# Patient Record
Sex: Male | Born: 1980 | Race: Black or African American | Hispanic: No | Marital: Single | Smoking: Never smoker
Health system: Southern US, Community
[De-identification: ages and names within clinical notes are randomized; demographics above are authoritative.]

---

## 2003-10-24 ENCOUNTER — Emergency Department: Payer: Self-pay | Admitting: Emergency Medicine

## 2003-11-04 ENCOUNTER — Emergency Department: Payer: Self-pay | Admitting: General Practice

## 2003-12-10 ENCOUNTER — Emergency Department: Payer: Self-pay | Admitting: Emergency Medicine

## 2008-02-03 ENCOUNTER — Emergency Department: Payer: Self-pay | Admitting: Emergency Medicine

## 2009-09-10 ENCOUNTER — Emergency Department: Payer: Self-pay | Admitting: Emergency Medicine

## 2009-09-29 ENCOUNTER — Emergency Department: Payer: Self-pay | Admitting: Emergency Medicine

## 2015-05-24 ENCOUNTER — Emergency Department: Payer: Self-pay

## 2015-05-24 ENCOUNTER — Emergency Department
Admission: EM | Admit: 2015-05-24 | Discharge: 2015-05-24 | Disposition: A | Discharge status: Home / Self Care | Payer: Self-pay | Attending: Emergency Medicine | Admitting: Emergency Medicine

## 2015-05-24 ENCOUNTER — Encounter: Payer: Self-pay | Admitting: *Deleted

## 2015-05-24 DIAGNOSIS — Y92410 Unspecified street and highway as the place of occurrence of the external cause: Secondary | ICD-10-CM | POA: Insufficient documentation

## 2015-05-24 DIAGNOSIS — S42021A Displaced fracture of shaft of right clavicle, initial encounter for closed fracture: Secondary | ICD-10-CM | POA: Insufficient documentation

## 2015-05-24 DIAGNOSIS — Z880 Allergy status to penicillin: Secondary | ICD-10-CM | POA: Insufficient documentation

## 2015-05-24 DIAGNOSIS — G930 Cerebral cysts: Secondary | ICD-10-CM | POA: Insufficient documentation

## 2015-05-24 DIAGNOSIS — Y939 Activity, unspecified: Secondary | ICD-10-CM | POA: Insufficient documentation

## 2015-05-24 DIAGNOSIS — Y999 Unspecified external cause status: Secondary | ICD-10-CM | POA: Insufficient documentation

## 2015-05-24 DIAGNOSIS — Q046 Congenital cerebral cysts: Secondary | ICD-10-CM

## 2015-05-24 DIAGNOSIS — S42001A Fracture of unspecified part of right clavicle, initial encounter for closed fracture: Secondary | ICD-10-CM

## 2015-05-24 MED ORDER — OXYCODONE-ACETAMINOPHEN 5-325 MG PO TABS
1.0000 | ORAL_TABLET | Freq: Once | ORAL | Status: AC
  Administered 2015-05-24: 1 via ORAL
  Filled 2015-05-24: qty 1

## 2015-05-24 MED ORDER — OXYCODONE-ACETAMINOPHEN 5-325 MG PO TABS: 1.0000 | ORAL_TABLET | Freq: Four times a day (QID) | ORAL | Status: DC | PRN

## 2015-05-24 NOTE — ED Notes (Signed)
After results come back from testing, provider determined level of acuity needed to be increased to a level 3

## 2015-05-24 NOTE — Discharge Instructions (Signed)
Clavicle Fracture The clavicle, also called the collarbone, is the long bone that connects your shoulder to your rib cage. You can feel your collarbone at the top of your shoulders and rib cage. A clavicle fracture is a broken clavicle. It is a common injury that can happen at any age.  CAUSES Common causes of a clavicle fracture include:  A direct blow to your shoulder.  A car accident.  A fall, especially if you try to break your fall with an outstretched arm. RISK FACTORS You may be at increased risk if:  You are younger than 25 years or older than 53 years. Most clavicle fractures happen to people who are younger than 25 years.  You are a male.  You play contact sports. SIGNS AND SYMPTOMS A fractured clavicle is painful. It also makes it hard to move your arm. Other signs and symptoms may include:  A shoulder that drops downward and forward.  Pain when trying to lift your shoulder.  Bruising, swelling, and tenderness over your clavicle.  A grinding noise when you try to move your shoulder.  A bump over your clavicle. DIAGNOSIS Your health care provider can usually diagnose a clavicle fracture by asking about your injury and examining your shoulder and clavicle. He or she may take an X-ray to determine the position of your clavicle. TREATMENT Treatment depends on the position of your clavicle after the fracture:  If the broken ends of the bone are not out of place, your health care provider may put your arm in a sling or wrap a support bandage around your chest (figure-of-eight wrap).  If the broken ends of the bone are out of place, you may need surgery. Surgery may involve placing screws, pins, or plates to keep your clavicle stable while it heals. Healing may take about 3 months. When your health care provider thinks your fracture has healed enough, you may have to do physical therapy to regain normal movement and build up your arm strength. HOME CARE INSTRUCTIONS    Apply ice to the injured area:  Put ice in a plastic bag.  Place a towel between your skin and the bag.  Leave the ice on for 20 minutes, 2-3 times a day.  If you have a wrap or splint:  Wear it all the time, and remove it only to take a bath or shower.  When you bathe or shower, keep your shoulder in the same position as when the sling or wrap is on.  Do not lift your arm.  If you have a figure-of-eight wrap:  Another person must tighten it every day.  It should be tight enough to hold your shoulders back.  Allow enough room to place your index finger between your body and the strap.  Loosen the wrap immediately if you feel numbness or tingling in your hands.  Only take medicines as directed by your health care provider.  Avoid activities that make the injury or pain worse for 4-6 weeks after surgery.  Keep all follow-up appointments. SEEK MEDICAL CARE IF:  Your medicine is not helping to relieve pain and swelling. SEEK IMMEDIATE MEDICAL CARE IF:  Your arm is numb, cold, or pale, even when the splint is loose. MAKE SURE YOU:   Understand these instructions.  Will watch your condition.  Will get help right away if you are not doing well or get worse.   This information is not intended to replace advice given to you by your health care provider.  Make sure you discuss any questions you have with your health care provider.   Document Released: 10/10/2004 Document Revised: 01/05/2013 Document Reviewed: 11/23/2012 Elsevier Interactive Patient Education 2016 Elsevier Inc.  Arachnoid Cysts An arachnoid cyst is a fluid-filled sac in the space between your brain or spinal cord and one of the membranes that covers the brain or spinal cord (arachnoid membrane). The cyst is filled with clear fluid (cerebrospinal fluid) that protects and nourishes your brain and spinal cord. Arachnoid cysts are not cancerous. Arachnoid cysts usually develop in the middle part of your brain.  They can also develop near your spinal cord. If a cyst is small and not causing symptoms, you may not need treatment. Your health care provider may check the cyst regularly to see if it grows over time. If your cyst is large enough to cause symptoms, you may need a surgical procedure to drain or remove it. CAUSES  The most common cause of an arachnoid cyst is a defect that develops before birth. This is called a primary arachnoid cyst. A secondary arachnoid cyst is less common and may be caused by:  Head injury.  Brain infection.  A tumor that develops after brain surgery. RISK FACTORS  You may be at risk for a primary arachnoid cyst if you:   Have a family history of brain cysts.  Have inherited genes that increase your risk.  Are male. You may be at risk for a secondary arachnoid cyst if you have had:  A brain injury.  A brain infection.  A brain tumor.  Brain surgery. SIGNS AND SYMPTOMS  It is possible to have an arachnoid cyst without any symptoms. If you do have symptoms, the type of symptoms will depend on the size and location of your cyst. Signs and symptoms of an arachnoid cyst near your brain may include:  Nausea and vomiting.  Headache.  Seizures.  Dizziness and loss of balance.  Problems with hearing or vision.  Clumsiness.  Weakness. Signs and symptoms of an arachnoid cyst near your spinal cord may include:  Leg weakness and tingling.  Arm weakness and tingling.  Curved spine.  Back pain.  Arm or leg pain.  Jerky movements of the arms or legs (spasticity). DIAGNOSIS  Your health care provider may suspect an arachnoid cyst from your signs and symptoms. You will need to have tests done to confirm the diagnosis. These may include:  CT scan.  MRI. TREATMENT  Surgery is the only treatment for an arachnoid cyst that causes symptoms. This may involve brain or spinal cord surgery to do one of the following:  Open and drain the cyst.  Drain the  cyst by placing a tube (shunt) into the cyst.  Remove the cyst completely.   This information is not intended to replace advice given to you by your health care provider. Make sure you discuss any questions you have with your health care provider.   Document Released: 12/21/2001 Document Revised: 01/21/2014 Document Reviewed: 02/18/2013 Elsevier Interactive Patient Education [employer] Elsevier Inc.
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
## 2015-08-04 ENCOUNTER — Other Ambulatory Visit: Payer: Self-pay | Admitting: Neurosurgery

## 2015-08-04 DIAGNOSIS — Q046 Congenital cerebral cysts: Secondary | ICD-10-CM

## 2015-09-11 ENCOUNTER — Ambulatory Visit: Payer: Self-pay

## 2016-11-14 LAB — HM HIV SCREENING LAB: HM HIV Screening: NEGATIVE
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
## 2019-03-16 ENCOUNTER — Other Ambulatory Visit: Payer: Self-pay

## 2019-03-16 ENCOUNTER — Encounter: Payer: Self-pay | Admitting: Physician Assistant

## 2019-03-16 ENCOUNTER — Ambulatory Visit: Payer: Self-pay | Admitting: Physician Assistant

## 2019-03-16 DIAGNOSIS — Z113 Encounter for screening for infections with a predominantly sexual mode of transmission: Secondary | ICD-10-CM

## 2019-03-16 LAB — GRAM STAIN

## 2019-03-16 NOTE — Progress Notes (Signed)
   [facility] Department STI clinic/screening visit  Subjective:  Dalton Herring. is a 39 y.o. male being seen today for an STI screening visit. The patient reports they do not have symptoms.    Patient has the following medical conditions:  There are no problems to display for this patient.    Chief Complaint  Patient presents with  . SEXUALLY TRANSMITTED DISEASE    HPI  Patient reports that he is not having any symptoms but would like a screening today.  Denies chronic conditions, medications and history of surgery.   See flowsheet for further details and programmatic requirements.    The following portions of the patient's history were reviewed and updated as appropriate: allergies, current medications, past medical history, past social history, past surgical history and problem list.  Objective:  There were no vitals filed for this visit.  Physical Exam Constitutional:      General: He is not in acute distress.    Appearance: Normal appearance. He is normal weight.  HENT:     Head: Normocephalic and atraumatic.     Comments: No nits, lice or hair loss. No cervical, supraclavicular or axillary adenopathy.    Mouth/Throat:     Mouth: Mucous membranes are moist.     Pharynx: Oropharynx is clear. No oropharyngeal exudate or posterior oropharyngeal erythema.  Eyes:     Conjunctiva/sclera: Conjunctivae normal.  Pulmonary:     Effort: Pulmonary effort is normal.  Abdominal:     Palpations: Abdomen is soft. There is no mass.     Tenderness: There is no abdominal tenderness. There is no guarding or rebound.  Genitourinary:    Penis: Normal.      Testes: Normal.     Comments: Pubic area without nits, lice, edema, erythema, lesions or inguinal adenopathy. Penis circumcised, without rash, lesion or discharge at meatus.  Musculoskeletal:     Cervical back: Neck supple. No tenderness.  Skin:    General: Skin is warm and dry.     Findings: No bruising,  erythema, lesion or rash.  Neurological:     Mental Status: He is alert and oriented to person, place, and time.  Psychiatric:        Mood and Affect: Mood normal.        Behavior: Behavior normal.        Thought Content: Thought content normal.        Judgment: Judgment normal.       Assessment and Plan:  Catalino Plascencia. is a 39 y.o. male presenting to the [facility] Department for STI screening  1. Screening for STD (sexually transmitted disease) Patient into clinic without symptoms.  Rec condoms with all sex. Await test results.  Counseled that RN will call if needs to RTC for treatment once results are back. - Gram stain - Gonococcus culture - HIV/HCV [place] [facility] - Syphilis Serology, [facility] - Gonococcus culture     No follow-ups on file.  No future appointments.  Matt Holmes, PA

## 2019-03-21 LAB — GONOCOCCUS CULTURE

## 2019-03-22 LAB — HM HIV SCREENING LAB: HM HIV Screening: NEGATIVE

## 2019-03-22 LAB — HM HEPATITIS C SCREENING LAB: HM Hepatitis Screen: NEGATIVE

## 2020-02-07 ENCOUNTER — Ambulatory Visit
Admission: RE | Admit: 2020-02-07 | Discharge: 2020-02-07 | Disposition: A | Discharge status: Home / Self Care | Payer: BLUE CROSS/BLUE SHIELD | Source: Ambulatory Visit | Attending: Family Medicine | Admitting: Family Medicine

## 2020-02-07 ENCOUNTER — Other Ambulatory Visit: Payer: Self-pay

## 2020-02-07 VITALS — BP 128/82 | HR 73 | Temp 98.4°F | Resp 18 | Ht 73.0 in | Wt 168.0 lb

## 2020-02-07 DIAGNOSIS — Z91040 Latex allergy status: Secondary | ICD-10-CM | POA: Diagnosis not present

## 2020-02-07 DIAGNOSIS — Z880 Allergy status to penicillin: Secondary | ICD-10-CM | POA: Diagnosis not present

## 2020-02-07 DIAGNOSIS — R0981 Nasal congestion: Secondary | ICD-10-CM | POA: Diagnosis present

## 2020-02-07 DIAGNOSIS — U071 COVID-19: Secondary | ICD-10-CM | POA: Insufficient documentation

## 2020-02-07 DIAGNOSIS — J069 Acute upper respiratory infection, unspecified: Secondary | ICD-10-CM

## 2020-02-07 LAB — RAPID INFLUENZA A&B ANTIGENS
Influenza A (ARMC): NEGATIVE
Influenza B (ARMC): NEGATIVE

## 2020-02-07 NOTE — ED Provider Notes (Signed)
[facility]    CSN: [id number] Arrival date & time: 02/07/20  1646      History   Chief Complaint No chief complaint on file.   HPI Adolf Ormiston. is a 40 y.o. male.   HPI   40 year old male here for evaluation of body aches, nasal congestion, fever, and chills that started yesterday.  Patient reports his T-max was one 1.5.  He has had associated symptoms of runny nose and nasal congestion.  Patient denies shortness of breath, wheezing, cough, sore throat, or GI complaints.  Patient has not been vaccinated against Covid or flu but is unaware of any Covid exposure.  History reviewed. No pertinent past medical history.  There are no problems to display for this patient.   History reviewed. No pertinent surgical history.     Home Medications    Prior to Admission medications   Not on File    Family History Family History  Problem Relation Age of Onset  . Cancer - Lung Mother   . Liver disease Father     Social History Social History   Tobacco Use  . Smoking status: Never Smoker  . Smokeless tobacco: Never Used  Vaping Use  . Vaping Use: Never used  Substance Use Topics  . Alcohol use: Yes    Comment: 3- 4 times a week  . Drug use: Yes    Types: Marijuana     Allergies   Latex and Penicillins   Review of Systems Review of Systems  Constitutional: Positive for fever. Negative for activity change and appetite change.  HENT: Positive for congestion and rhinorrhea. Negative for ear pain and sore throat.   Respiratory: Negative for cough, shortness of breath and wheezing.   Gastrointestinal: Negative for diarrhea, nausea and vomiting.  Musculoskeletal: Positive for arthralgias and myalgias.  Skin: Negative for rash.  Neurological: Positive for headaches.  Hematological: Negative.   Psychiatric/Behavioral: Negative.      Physical Exam Triage Vital Signs ED Triage Vitals  Enc Vitals Group     BP 02/07/20 1714 128/82     Pulse  Rate 02/07/20 1714 73     Resp 02/07/20 1714 18     Temp 02/07/20 1714 98.4 F (36.9 C)     Temp Source 02/07/20 1714 Oral     SpO2 02/07/20 1714 99 %     Weight 02/07/20 1711 168 lb (76.2 kg)     Height 02/07/20 1711 6\' 1"  (1.854 m)     Head Circumference --      Peak Flow --      Pain Score 02/07/20 1711 0     Pain Loc --      Pain Edu? --      Excl. in GC? --    No data found.  Updated Vital Signs BP 128/82   Pulse 73   Temp 98.4 F (36.9 C) (Oral)   Resp 18   Ht 6\' 1"  (1.854 m)   Wt 168 lb (76.2 kg)   SpO2 99%   BMI 22.16 kg/m   Visual Acuity Right Eye Distance:   Left Eye Distance:   Bilateral Distance:    Right Eye Near:   Left Eye Near:    Bilateral Near:     Physical Exam Vitals and nursing note reviewed.  Constitutional:      General: He is not in acute distress.    Appearance: Normal appearance. He is normal weight. He is not toxic-appearing.  HENT:  Head: Normocephalic and atraumatic.     Right Ear: Tympanic membrane, ear canal and external ear normal.     Left Ear: Tympanic membrane, ear canal and external ear normal.     Nose: Congestion and rhinorrhea present.     Comments: Nasal mucosa is erythematous and edematous with scant clear nasal discharge.    Mouth/Throat:     Mouth: Mucous membranes are moist.     Pharynx: Oropharynx is clear. No oropharyngeal exudate or posterior oropharyngeal erythema.  Cardiovascular:     Rate and Rhythm: Normal rate and regular rhythm.     Pulses: Normal pulses.     Heart sounds: Normal heart sounds. No murmur heard. No gallop.   Pulmonary:     Effort: Pulmonary effort is normal.     Breath sounds: Normal breath sounds. No wheezing, rhonchi or rales.  Musculoskeletal:     Cervical back: Normal range of motion and neck supple.  Lymphadenopathy:     Cervical: No cervical adenopathy.  Skin:    General: Skin is warm and dry.     Capillary Refill: Capillary refill takes less than 2 seconds.     Findings: No  erythema.  Neurological:     General: No focal deficit present.     Mental Status: He is alert and oriented to person, place, and time.  Psychiatric:        Mood and Affect: Mood normal.        Behavior: Behavior normal.        Thought Content: Thought content normal.        Judgment: Judgment normal.      UC Treatments / Results  Labs (all labs ordered are listed, but only abnormal results are displayed) Labs Reviewed  SARS CORONAVIRUS 2 (TAT 6-24 HRS)  RAPID INFLUENZA A&B ANTIGENS    EKG   Radiology No results found.  Procedures Procedures (including critical care time)  Medications Ordered in UC Medications - No data to display  Initial Impression / Assessment and Plan / UC Course  I have reviewed the triage vital signs and the nursing notes.  Pertinent labs & imaging results that were available during my care of the patient were reviewed by me and considered in my medical decision making (see chart for details).   Patient presents for evaluation of Covid-like symptoms that started yesterday.  Patient's upper respiratory exam reveals erythematous and edematous nasal mucosa.  Oropharyngeal exam is negative for erythema, exudate, or postnasal drip.  No cervical lymphadenopathy appreciated on exam.  Lungs are clear to auscultation all fields.  Will swab patient for Covid and discharge him home to isolate.  Advised supportive care.   Final Clinical Impressions(s) / UC Diagnoses   Final diagnoses:  Acute upper respiratory infection, unspecified     Discharge Instructions     Isolate at home until the results of your Covid test are back.  If you test positive then you will have to quarantine for 5 days from when your symptoms started.  After the 5 days you can break quarantine if your symptoms have improved and you have not had a fever for 24 hours without taking Tylenol or ibuprofen.  Use over-the-counter Tylenol and ibuprofen as needed for pain and fever.  If you  develop shortness of breath-especially at rest, you were unable to speak in full sentences, or as a late sign your lips start turning blue in the go the ER for evaluation.    ED Prescriptions  None     PDMP not reviewed this encounter.   Becky Augusta, NP 02/07/20 [phone]

## 2020-02-07 NOTE — ED Triage Notes (Signed)
Pt presents with c/o general body aches, nasal congestion fever (101.5) and chills since yesterday. Pt denies n/v/d. Pt did take an at-home COVID test and it was negative. Pt has taken Tylenol with some improvement. Pt denies any sick contacts.

## 2020-02-07 NOTE — Discharge Instructions (Addendum)
Isolate at home until the results of your Covid test are back.  If you test positive then you will have to quarantine for 5 days from when your symptoms started.  After the 5 days you can break quarantine if your symptoms have improved and you have not had a fever for 24 hours without taking Tylenol or ibuprofen.  Use over-the-counter Tylenol and ibuprofen as needed for pain and fever.  If you develop shortness of breath-especially at rest, you were unable to speak in full sentences, or as a late sign your lips start turning blue in the go the ER for evaluation.

## 2020-02-08 LAB — SARS CORONAVIRUS 2 (TAT 6-24 HRS): SARS Coronavirus 2: POSITIVE — AB

## 2021-02-21 ENCOUNTER — Other Ambulatory Visit: Payer: Self-pay

## 2021-02-21 ENCOUNTER — Encounter: Payer: Self-pay | Admitting: Family Medicine

## 2021-02-21 ENCOUNTER — Ambulatory Visit: Payer: Self-pay | Admitting: Family Medicine

## 2021-02-21 DIAGNOSIS — Z113 Encounter for screening for infections with a predominantly sexual mode of transmission: Secondary | ICD-10-CM

## 2021-02-21 LAB — GRAM STAIN

## 2021-02-21 NOTE — Progress Notes (Signed)
Pt here for STD screening.  Gram stain results reviewed, no treatment required.  Pt declined condoms. Marelin Tat M Denina Rieger, RN  

## 2021-02-21 NOTE — Progress Notes (Signed)
[facility] Department STI clinic/screening visit  Subjective:  Dalton Rueb. is a 41 y.o. male being seen today for an STI screening visit. The patient reports they do not have symptoms.    Patient has the following medical conditions:  There are no problems to display for this patient.    Chief Complaint  Patient presents with   SEXUALLY TRANSMITTED DISEASE    screening    HPI  Patient reports he is here for STD screen.  Denies symptoms.  Does the patient or their partner desires a pregnancy in the next year? No  Screening for MPX risk: Does the patient have an unexplained rash? No Is the patient MSM? No Does the patient endorse multiple sex partners or anonymous sex partners? No Did the patient have close or sexual contact with a person diagnosed with MPX? No Has the patient traveled outside the [place] where MPX is endemic? No Is there a high clinical suspicion for MPX-- evidenced by one of the following No  -Unlikely to be chickenpox  -Lymphadenopathy  -Rash that present in same phase of evolution on any given body part   See flowsheet for further details and programmatic requirements.    The following portions of the patient's history were reviewed and updated as appropriate: allergies, current medications, past medical history, past social history, past surgical history and problem list.  Objective:  There were no vitals filed for this visit.  Physical Exam Constitutional:      Appearance: Normal appearance.  HENT:     Head: Normocephalic and atraumatic.     Comments: No nits or hair loss    Mouth/Throat:     Mouth: Mucous membranes are moist.     Pharynx: Oropharynx is clear. No oropharyngeal exudate or posterior oropharyngeal erythema.  Pulmonary:     Effort: Pulmonary effort is normal.  Abdominal:     General: Abdomen is flat.     Palpations: Abdomen is soft. There is no hepatomegaly or mass.     Tenderness: There is no abdominal tenderness.   Genitourinary:    Pubic Area: No rash or pubic lice.      Penis: Normal.      Testes: Normal.     Epididymis:     Right: Normal.     Left: Normal.     Rectum: Normal.  Lymphadenopathy:     Head:     Right side of head: No preauricular or posterior auricular adenopathy.     Left side of head: No preauricular or posterior auricular adenopathy.     Cervical: No cervical adenopathy.     Upper Body:     Right upper body: No supraclavicular or axillary adenopathy.     Left upper body: No supraclavicular or axillary adenopathy.     Lower Body: No right inguinal adenopathy. No left inguinal adenopathy.  Skin:    General: Skin is warm and dry.     Findings: No rash.  Neurological:     Mental Status: He is alert and oriented to person, place, and time.      Assessment and Plan:  Dalton Herring. is a 41 y.o. male presenting to the [facility] Department for STI screening  1. Screening examination for venereal disease  - Gram stain - Gonococcus culture - Gonococcus culture  Co. To Use condoms for STD prevention  Return if symptoms worsen or fail to improve.  No future appointments.  Larene Pickett, FNP

## 2021-02-25 LAB — GONOCOCCUS CULTURE

## 2021-03-28 ENCOUNTER — Ambulatory Visit: Payer: Self-pay

## 2021-03-28 ENCOUNTER — Ambulatory Visit: Payer: Self-pay | Admitting: Nurse Practitioner

## 2021-03-28 ENCOUNTER — Other Ambulatory Visit: Payer: Self-pay

## 2021-03-28 DIAGNOSIS — Z113 Encounter for screening for infections with a predominantly sexual mode of transmission: Secondary | ICD-10-CM

## 2021-03-28 LAB — GRAM STAIN

## 2021-03-28 LAB — HM HEPATITIS C SCREENING LAB: HM Hepatitis Screen: NEGATIVE

## 2021-03-28 LAB — HM HIV SCREENING LAB: HM HIV Screening: NEGATIVE

## 2021-03-28 LAB — HEPATITIS B SURFACE ANTIGEN: Hepatitis B Surface Ag: NONREACTIVE

## 2021-03-28 NOTE — Progress Notes (Signed)
Pt here for STD screening.  Gram stain results reviewed, no treatment required per SO.  Pt declined condoms.  Windle Guard, RN ? ?

## 2021-03-28 NOTE — Progress Notes (Signed)
[facility] ?STI clinic/screening visit ? ?Subjective:  ?Dalton Herring. is a 41 y.o. male being seen today for an STI screening visit. The patient reports they do have symptoms.   ? ?Patient has the following medical conditions:  There are no problems to display for this patient. ? ? ? ?Chief Complaint  ?Patient presents with  ? SEXUALLY TRANSMITTED DISEASE  ?  Screening  ? ? ?HPI ? ?Patient reports to clinic today for STD screening.  Patient reports discharge that began today. Patient states discharge is clear and reports no contacts with similar symptoms.   ? ?Does the patient or their partner desires a pregnancy in the next year? No ? ?Screening for MPX risk: ?Does the patient have an unexplained rash? No ?Is the patient MSM? No ?Does the patient endorse multiple sex partners or anonymous sex partners? No ?Did the patient have close or sexual contact with a person diagnosed with MPX? No ?Has the patient traveled outside the [place] where MPX is endemic? No ?Is there a high clinical suspicion for MPX-- evidenced by one of the following No ? -Unlikely to be chickenpox ? -Lymphadenopathy ? -Rash that present in same phase of evolution on any given body part ? ? ?See flowsheet for further details and programmatic requirements.  ? ? ?The following portions of the patient's history were reviewed and updated as appropriate: allergies, current medications, past medical history, past social history, past surgical history and problem list. ? ?Objective:  ?There were no vitals filed for this visit. ? ?Physical Exam ?Constitutional:   ?   Appearance: Normal appearance. He is well-developed.  ?HENT:  ?   Head: Normocephalic.  ?   Right Ear: External ear normal.  ?   Left Ear: External ear normal.  ?   Nose: Nose normal.  ?   Mouth/Throat:  ?   Mouth: Mucous membranes are moist.  ?   Comments: Poor dentition  ?Pulmonary:  ?   Effort: Pulmonary effort is normal.  ?Abdominal:  ?   General: Abdomen is flat.  ?    Palpations: Abdomen is soft.  ?Genitourinary: ?   Penis: Circumcised.   ?   Comments: Pubic area without nits, lice, hair loss, edema, erythema, lesions and inguinal adenopathy. ?Penis without rash, lesions and discharge at meatus. ?Testicles descended bilaterally,nt, no masses or edema.  ?Musculoskeletal:  ?   Cervical back: Full passive range of motion without pain, normal range of motion and neck supple.  ?Lymphadenopathy:  ?   Cervical: Cervical adenopathy present.  ?   Left cervical: Deep cervical adenopathy present.  ?   Lower Body: Left inguinal adenopathy present.  ?Skin: ?   General: Skin is warm and dry.  ?Neurological:  ?   Mental Status: He is alert and oriented to person, place, and time.  ?Psychiatric:     ?   Attention and Perception: Attention normal.     ?   Mood and Affect: Mood normal.     ?   Speech: Speech normal.     ?   Behavior: Behavior is cooperative.  ? ? ? ? ?Assessment and Plan:  ?[name] is a 41 y.o. male presenting to the [facility] for STI screening ? ?1. Screening examination for venereal disease ?-41 year old male in clinic today for STD screening.  ?-No clear discharge noted on exam.  Will await test results. ?-Patient does have STI symptoms ?Patient accepted all screenings including  oral GC and bloodwork for HIV/RPR, HBV/HCV.  ?Patient meets criteria for HepB screening? Yes. Ordered? Yes ?Patient meets criteria for HepC screening? Yes. Ordered? Yes ?Recommended condom use with all sex ?Discussed importance of condom use for STI prevent ? ?Treat gram stain per standing order ? ?Discussed time line for State Lab results and that patient will be called with positive results and encouraged patient to call if he had not heard in 2 weeks ?Recommended returning for continued or worsening symptoms.   ? ?- HIV/HCV [facility] ?- Syphilis Serology,  Lab ?- HBV Antigen/Antibody [facility] ?- Gonococcus culture ?- Gonococcus culture ?- Gram  stain ? ? ? ? ?Return if symptoms worsen or fail to improve. ? ?No future appointments. ? ?Dalton Cromer, FNP ? ?

## 2021-04-02 LAB — GONOCOCCUS CULTURE

## 2021-05-28 DIAGNOSIS — R251 Tremor, unspecified: Secondary | ICD-10-CM | POA: Insufficient documentation

## 2022-02-26 ENCOUNTER — Ambulatory Visit: Payer: Self-pay | Admitting: Family Medicine

## 2022-02-26 ENCOUNTER — Encounter: Payer: Self-pay | Admitting: Family Medicine

## 2022-02-26 DIAGNOSIS — Z113 Encounter for screening for infections with a predominantly sexual mode of transmission: Secondary | ICD-10-CM

## 2022-02-26 LAB — HM HIV SCREENING LAB: HM HIV Screening: NEGATIVE

## 2022-02-26 LAB — HM HEPATITIS C SCREENING LAB: HM Hepatitis Screen: NEGATIVE

## 2022-02-26 NOTE — Progress Notes (Signed)
[facility] Department STI clinic/screening visit  Subjective:  Jaiveer Trabert Junior Graessle is a 42 y.o. male being seen today for an STI screening visit. The patient reports they do not have symptoms.    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 No  Screening for MPX risk: Does the patient have an unexplained rash? No Is the patient MSM? No Does the patient endorse multiple sex partners or anonymous sex partners? No Did the patient have close or sexual contact with a person diagnosed with MPX? No Has the patient traveled outside the [place] where MPX is endemic? No Is there a high clinical suspicion for MPX-- evidenced by one of the following No  -Unlikely to be chickenpox  -Lymphadenopathy  -Rash that present in same phase of evolution on any given body part   See flowsheet for further details and programmatic requirements.   Immunization History  Administered Date(s) Administered   Tdap 10/09/2007, 05/28/2021     The following portions of the patient's history were reviewed and updated as appropriate: allergies, current medications, past medical history, past social history, past surgical history and problem list.  Objective:  There were no vitals filed for this visit.  Physical Exam Vitals and nursing note reviewed.  Constitutional:      Appearance: Normal appearance.  HENT:     Head: Normocephalic and atraumatic.     Mouth/Throat:     Mouth: Mucous membranes are  moist.     Pharynx: No oropharyngeal exudate or posterior oropharyngeal erythema.  Eyes:     General:        Right eye: No discharge.        Left eye: No discharge.     Conjunctiva/sclera:     Right eye: Right conjunctiva is not injected. No exudate.    Left eye: Left conjunctiva is not injected. No exudate. Pulmonary:     Effort: Pulmonary effort is normal.  Abdominal:     General: Abdomen is flat.     Palpations: Abdomen is soft. There is no hepatomegaly or mass.     Tenderness: There is no abdominal tenderness. There is no rebound.  Genitourinary:    Comments: Declined genital exam- asymptomatic Lymphadenopathy:     Cervical: No cervical adenopathy.     Upper Body:     Right upper body: No supraclavicular or axillary adenopathy.     Left upper body: No supraclavicular or axillary adenopathy.  Skin:    General: Skin is warm and dry.  Neurological:     Mental Status: He is alert and oriented to person, place, and time.    Assessment and Plan:  Prudencio Burly Junior Fluckiger is a 42 y.o. male presenting to the [facility] for STI screening  1. Screening for venereal disease  - Chlamydia/GC NAA, Confirmation - HIV [facility] - Syphilis Serology, [facility] - HIV/HCV [facility] [facility] - HBV Antigen/Antibody [facility]  Lab - Gonococcus culture   Patient does not have STI symptoms Patient accepted all screenings including   Patient meets criteria for HepB screening? Yes. Ordered? yes Patient meets criteria for HepC screening? Yes. Ordered? yes Recommended condom use with all sex Discussed importance of condom use for STI prevent  Treat positive test results per standing order. Discussed time line for State Lab results and that patient will be called with positive results and encouraged patient to call if he had not heard in 2 weeks Recommended repeat testing in 3 months with positive results. Recommended returning for continued or worsening symptoms.    Return if symptoms worsen or fail to improve.  Total time spent 20 minutes  Sharlet Salina, [place]

## 2022-03-02 LAB — CHLAMYDIA/GC NAA, CONFIRMATION
Chlamydia trachomatis, NAA: NEGATIVE
Neisseria gonorrhoeae, NAA: NEGATIVE

## 2022-03-02 LAB — GONOCOCCUS CULTURE

## 2022-03-15 DIAGNOSIS — Z419 Encounter for procedure for purposes other than remedying health state, unspecified: Secondary | ICD-10-CM | POA: Diagnosis not present

## 2022-03-26 NOTE — Addendum Note (Signed)
Addended by: Windle Guard on: 03/26/2022 08:33 AM   Modules accepted: Orders

## 2022-04-09 NOTE — Addendum Note (Signed)
Addended by: Sharlet Salina on: 04/09/2022 11:21 AM   Modules accepted: Orders

## 2022-04-15 DIAGNOSIS — Z419 Encounter for procedure for purposes other than remedying health state, unspecified: Secondary | ICD-10-CM | POA: Diagnosis not present

## 2022-05-15 DIAGNOSIS — Z419 Encounter for procedure for purposes other than remedying health state, unspecified: Secondary | ICD-10-CM | POA: Diagnosis not present

## 2022-06-15 DIAGNOSIS — Z419 Encounter for procedure for purposes other than remedying health state, unspecified: Secondary | ICD-10-CM | POA: Diagnosis not present

## 2022-07-15 DIAGNOSIS — Z419 Encounter for procedure for purposes other than remedying health state, unspecified: Secondary | ICD-10-CM | POA: Diagnosis not present

## 2022-08-15 DIAGNOSIS — Z419 Encounter for procedure for purposes other than remedying health state, unspecified: Secondary | ICD-10-CM | POA: Diagnosis not present

## 2022-09-15 DIAGNOSIS — Z419 Encounter for procedure for purposes other than remedying health state, unspecified: Secondary | ICD-10-CM | POA: Diagnosis not present

## 2022-10-15 DIAGNOSIS — Z419 Encounter for procedure for purposes other than remedying health state, unspecified: Secondary | ICD-10-CM | POA: Diagnosis not present

## 2022-11-15 DIAGNOSIS — Z419 Encounter for procedure for purposes other than remedying health state, unspecified: Secondary | ICD-10-CM | POA: Diagnosis not present

## 2022-12-06 ENCOUNTER — Ambulatory Visit: Payer: Medicaid Other

## 2022-12-15 DIAGNOSIS — Z419 Encounter for procedure for purposes other than remedying health state, unspecified: Secondary | ICD-10-CM | POA: Diagnosis not present

## 2022-12-16 ENCOUNTER — Ambulatory Visit: Payer: Medicaid Other

## 2023-01-15 DIAGNOSIS — Z419 Encounter for procedure for purposes other than remedying health state, unspecified: Secondary | ICD-10-CM | POA: Diagnosis not present

## 2023-02-15 DIAGNOSIS — Z419 Encounter for procedure for purposes other than remedying health state, unspecified: Secondary | ICD-10-CM | POA: Diagnosis not present

## 2023-02-17 ENCOUNTER — Encounter: Payer: Self-pay | Admitting: Nurse Practitioner

## 2023-02-17 ENCOUNTER — Ambulatory Visit: Payer: Medicaid Other | Admitting: Nurse Practitioner

## 2023-02-17 DIAGNOSIS — Z113 Encounter for screening for infections with a predominantly sexual mode of transmission: Secondary | ICD-10-CM

## 2023-02-17 LAB — HM HIV SCREENING LAB: HM HIV Screening: NEGATIVE

## 2023-02-17 LAB — HM HEPATITIS C SCREENING LAB: HM Hepatitis Screen: NEGATIVE

## 2023-02-17 LAB — HEPATITIS B SURFACE ANTIGEN: Hepatitis B Surface Ag: NONREACTIVE

## 2023-02-17 NOTE — Progress Notes (Signed)
[facility] Department STI clinic [address], [address] [place] [address] Main phone: [phone]  STI screening visit  Subjective:  Dalton Herring is a 43 y.o. male being seen today for an STI screening visit. The patient reports they do not have symptoms.    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 male who presents to the office today requesting asymptomatic STI testing. He reports 3 male partners in the last 2 months, and practices penile/vaginal penetrative sex and oral sex. Patient reports using condoms sometimes. Patient indicates a history of gonorrhea, "crabs" at 43 years old, and NGU. Most recently NGU about 13-14 years ago. He reports last sex was yesterday.    STI screening history: Last HIV test per patient/review of record was  Lab Results  Component Value Date   HMHIVSCREEN Negative - Validated 02/26/2022   No results found for: "HIV"  Last HEPC test per patient/review of record was  Lab Results  Component Value Date   HMHEPCSCREEN Negative-Validated 02/26/2022   No components found for: "HEPC"   Last HEPB test per patient/review of record was No components found for: "HMHEPBSCREEN"   Fertility: Does the patient or their partner desires a pregnancy in the next year? No  Screening for MPX risk: Does the patient have an unexplained rash? No Is the patient MSM? No Does the patient endorse multiple sex partners or anonymous sex partners? Yes Did the patient have close or sexual contact with a person diagnosed with MPX? No Has the patient traveled outside the [place] where MPX is endemic? No Is there a high clinical suspicion for MPX-- evidenced by one of the following No  -Unlikely to be  chickenpox  -Lymphadenopathy  -Rash that present in same phase of evolution on any given body part   See flowsheet for further details and programmatic requirements.   Immunization History  Administered Date(s) Administered   Tdap 10/09/2007, 05/28/2021     The following portions of the patient's history were reviewed and updated as appropriate: allergies, current medications, past medical history, past social history, past surgical history and problem list.  Objective:  There were no vitals filed for this visit.  Physical Exam Nursing note reviewed.  Constitutional:      Appearance: Normal appearance.  HENT:     Head: Normocephalic.     Salivary Glands: Right salivary gland is not diffusely enlarged or tender. Left salivary gland is not diffusely enlarged or tender.     Mouth/Throat:     Lips: Pink. No lesions.     Mouth: Mucous membranes are moist. No oral lesions.     Tongue: No lesions. Tongue does not deviate from midline.     Pharynx: Oropharynx is clear. Uvula midline. No oropharyngeal exudate or posterior oropharyngeal erythema.     Tonsils: No tonsillar exudate.  Eyes:     General:        Right eye: No discharge.        Left eye: No discharge.     Conjunctiva/sclera:     Right eye: Right conjunctiva is not injected. No exudate.    Left eye: Left conjunctiva is not injected. No exudate. Pulmonary:     Effort: Pulmonary effort is  normal.  Genitourinary:    Comments: Patient asymptomatic and declines genital exam.  Lymphadenopathy:     Head:     Right side of head: No submental, submandibular, tonsillar, preauricular or posterior auricular adenopathy.     Left side of head: No submental, submandibular, tonsillar, preauricular or posterior auricular adenopathy.     Cervical: No cervical adenopathy.     Right cervical: No superficial or posterior cervical adenopathy.    Left cervical: No superficial or posterior cervical adenopathy.     Upper Body:     Right upper  body: No supraclavicular or axillary adenopathy.     Left upper body: No supraclavicular or axillary adenopathy.  Skin:    General: Skin is warm and dry.     Findings: No lesion or rash.     Comments: Skin tone appropriate for ethnicity. Assessed exposed areas and back only.   Neurological:     Mental Status: He is alert and oriented to person, place, and time.  Psychiatric:        Attention and Perception: Attention and perception normal.        Mood and Affect: Mood and affect normal.        Speech: Speech normal.        Behavior: Behavior normal. Behavior is cooperative.        Thought Content: Thought content normal.     Assessment and Plan:  Dalton Herring is a 43 y.o. male presenting to the [facility] Department for STI screening  1. Screening for venereal disease (Primary)  - HIV/HCV [facility] - HBV Antigen/Antibody [facility] - Syphilis Serology, [facility] - Gonococcus culture - Chlamydia/GC NAA, Confirmation   Patient does not have STI symptoms Patient accepted all screenings including  urine GC/Chlamydia, and blood work for HIV/Syphilis. Patient meets criteria for HepB screening? Yes. Ordered? yes Patient meets criteria for HepC screening? Yes. Ordered? yes Recommended condom use with all sex Discussed importance of condom use for STI prevention  Treat positive test results per standing order. Discussed time line for [facility] results and that patient will be called with positive results and encouraged patient to call if he had not heard in 2 weeks Recommended repeat testing in 3 months with positive results. Recommended returning for continued or worsening symptoms.   Return if symptoms worsen or fail to improve.  No future appointments.  Total time with patient 20 minutes.   Edmonia James, NP

## 2023-02-17 NOTE — Progress Notes (Signed)
Pt here for STI screen.  Denies symptoms.  No in house labs performed.  Non-latex condoms provided to patient.  Pt to call if any questions or concerns.-An Schnabel, RN

## 2023-02-21 LAB — GONOCOCCUS CULTURE

## 2023-02-21 LAB — CHLAMYDIA/GC NAA, CONFIRMATION
Chlamydia trachomatis, NAA: NEGATIVE
Neisseria gonorrhoeae, NAA: NEGATIVE

## 2023-02-28 DIAGNOSIS — G250 Essential tremor: Secondary | ICD-10-CM | POA: Diagnosis not present

## 2023-02-28 DIAGNOSIS — I10 Essential (primary) hypertension: Secondary | ICD-10-CM | POA: Diagnosis not present

## 2023-02-28 DIAGNOSIS — G588 Other specified mononeuropathies: Secondary | ICD-10-CM | POA: Diagnosis not present

## 2023-03-15 DIAGNOSIS — Z419 Encounter for procedure for purposes other than remedying health state, unspecified: Secondary | ICD-10-CM | POA: Diagnosis not present

## 2023-03-26 DIAGNOSIS — E559 Vitamin D deficiency, unspecified: Secondary | ICD-10-CM | POA: Diagnosis not present

## 2023-03-26 DIAGNOSIS — G588 Other specified mononeuropathies: Secondary | ICD-10-CM | POA: Diagnosis not present

## 2023-03-26 DIAGNOSIS — R5383 Other fatigue: Secondary | ICD-10-CM | POA: Diagnosis not present

## 2023-03-26 DIAGNOSIS — G250 Essential tremor: Secondary | ICD-10-CM | POA: Diagnosis not present

## 2023-03-26 DIAGNOSIS — Z13220 Encounter for screening for lipoid disorders: Secondary | ICD-10-CM | POA: Diagnosis not present

## 2023-03-26 DIAGNOSIS — Z1329 Encounter for screening for other suspected endocrine disorder: Secondary | ICD-10-CM | POA: Diagnosis not present

## 2023-03-26 DIAGNOSIS — Z130 Encounter for screening for diseases of the blood and blood-forming organs and certain disorders involving the immune mechanism: Secondary | ICD-10-CM | POA: Diagnosis not present

## 2023-03-26 DIAGNOSIS — Z0000 Encounter for general adult medical examination without abnormal findings: Secondary | ICD-10-CM | POA: Diagnosis not present

## 2023-03-26 DIAGNOSIS — Z131 Encounter for screening for diabetes mellitus: Secondary | ICD-10-CM | POA: Diagnosis not present

## 2023-04-01 NOTE — Addendum Note (Signed)
 Addended by: Heywood Bene on: 04/01/2023 02:06 PM   Modules accepted: Orders

## 2023-04-26 DIAGNOSIS — Z419 Encounter for procedure for purposes other than remedying health state, unspecified: Secondary | ICD-10-CM | POA: Diagnosis not present

## 2023-05-26 DIAGNOSIS — Z419 Encounter for procedure for purposes other than remedying health state, unspecified: Secondary | ICD-10-CM | POA: Diagnosis not present

## 2023-06-26 DIAGNOSIS — Z419 Encounter for procedure for purposes other than remedying health state, unspecified: Secondary | ICD-10-CM | POA: Diagnosis not present

## 2023-07-26 DIAGNOSIS — Z419 Encounter for procedure for purposes other than remedying health state, unspecified: Secondary | ICD-10-CM | POA: Diagnosis not present

## 2023-08-26 DIAGNOSIS — Z419 Encounter for procedure for purposes other than remedying health state, unspecified: Secondary | ICD-10-CM | POA: Diagnosis not present

## 2023-09-26 DIAGNOSIS — Z419 Encounter for procedure for purposes other than remedying health state, unspecified: Secondary | ICD-10-CM | POA: Diagnosis not present

## 2023-11-26 DIAGNOSIS — Z419 Encounter for procedure for purposes other than remedying health state, unspecified: Secondary | ICD-10-CM | POA: Diagnosis not present
